# Patient Record
Sex: Female | Born: 1970 | Race: White | Hispanic: No | Marital: Single | Smoking: Current some day smoker
Health system: Southern US, Community
[De-identification: ages and names within clinical notes are randomized; demographics above are authoritative.]

## PROBLEM LIST (undated history)

## (undated) DIAGNOSIS — F329 Major depressive disorder, single episode, unspecified: Secondary | ICD-10-CM

## (undated) DIAGNOSIS — F32A Depression, unspecified: Secondary | ICD-10-CM

## (undated) HISTORY — PX: BREAST BIOPSY: SHX20

## (undated) HISTORY — PX: ABDOMINAL HYSTERECTOMY: SHX81

---

## 1997-08-24 ENCOUNTER — Other Ambulatory Visit: Admission: RE | Admit: 1997-08-24 | Discharge: 1997-08-24 | Payer: Self-pay | Admitting: *Deleted

## 1997-08-24 ENCOUNTER — Other Ambulatory Visit: Admission: RE | Admit: 1997-08-24 | Discharge: 1997-08-24 | Payer: Self-pay | Admitting: Otolaryngology

## 1999-02-21 ENCOUNTER — Other Ambulatory Visit: Admission: RE | Admit: 1999-02-21 | Discharge: 1999-02-21 | Payer: Self-pay | Admitting: *Deleted

## 1999-10-27 ENCOUNTER — Inpatient Hospital Stay (HOSPITAL_COMMUNITY): Admission: AD | Admit: 1999-10-27 | Discharge: 1999-10-28 | Payer: Self-pay | Admitting: Obstetrics and Gynecology

## 1999-10-29 ENCOUNTER — Encounter: Admission: RE | Admit: 1999-10-29 | Discharge: 2000-01-27 | Payer: Self-pay | Admitting: Obstetrics and Gynecology

## 2000-01-29 ENCOUNTER — Encounter: Admission: RE | Admit: 2000-01-29 | Discharge: 2000-03-27 | Payer: Self-pay | Admitting: Obstetrics and Gynecology

## 2000-12-03 ENCOUNTER — Encounter: Admission: RE | Admit: 2000-12-03 | Discharge: 2000-12-03 | Payer: Self-pay | Admitting: Obstetrics and Gynecology

## 2000-12-03 ENCOUNTER — Encounter: Payer: Self-pay | Admitting: Obstetrics and Gynecology

## 2001-01-21 ENCOUNTER — Other Ambulatory Visit: Admission: RE | Admit: 2001-01-21 | Discharge: 2001-01-21 | Payer: Self-pay | Admitting: Obstetrics and Gynecology

## 2001-02-23 ENCOUNTER — Encounter: Admission: RE | Admit: 2001-02-23 | Discharge: 2001-02-23 | Payer: Self-pay | Admitting: Surgery

## 2001-02-23 ENCOUNTER — Encounter (INDEPENDENT_AMBULATORY_CARE_PROVIDER_SITE_OTHER): Payer: Self-pay | Admitting: *Deleted

## 2001-02-23 ENCOUNTER — Encounter: Payer: Self-pay | Admitting: Surgery

## 2001-06-29 ENCOUNTER — Inpatient Hospital Stay (HOSPITAL_COMMUNITY): Admission: AD | Admit: 2001-06-29 | Discharge: 2001-07-03 | Payer: Self-pay | Admitting: Obstetrics & Gynecology

## 2001-06-30 ENCOUNTER — Encounter: Payer: Self-pay | Admitting: Obstetrics & Gynecology

## 2001-07-08 ENCOUNTER — Encounter (HOSPITAL_COMMUNITY): Admission: RE | Admit: 2001-07-08 | Discharge: 2001-07-27 | Payer: Self-pay | Admitting: Obstetrics and Gynecology

## 2001-07-27 ENCOUNTER — Encounter (INDEPENDENT_AMBULATORY_CARE_PROVIDER_SITE_OTHER): Payer: Self-pay | Admitting: Specialist

## 2001-07-27 ENCOUNTER — Inpatient Hospital Stay (HOSPITAL_COMMUNITY): Admission: AD | Admit: 2001-07-27 | Discharge: 2001-07-29 | Payer: Self-pay | Admitting: Obstetrics and Gynecology

## 2001-09-13 ENCOUNTER — Other Ambulatory Visit: Admission: RE | Admit: 2001-09-13 | Discharge: 2001-09-13 | Payer: Self-pay | Admitting: Obstetrics and Gynecology

## 2004-02-28 ENCOUNTER — Encounter: Admission: RE | Admit: 2004-02-28 | Discharge: 2004-02-28 | Payer: Self-pay | Admitting: Obstetrics and Gynecology

## 2006-09-11 ENCOUNTER — Encounter (INDEPENDENT_AMBULATORY_CARE_PROVIDER_SITE_OTHER): Payer: Self-pay | Admitting: Obstetrics and Gynecology

## 2006-09-11 ENCOUNTER — Ambulatory Visit (HOSPITAL_COMMUNITY): Admission: RE | Admit: 2006-09-11 | Discharge: 2006-09-11 | Payer: Self-pay | Admitting: Obstetrics and Gynecology

## 2007-01-11 ENCOUNTER — Encounter: Admission: RE | Admit: 2007-01-11 | Discharge: 2007-01-11 | Payer: Self-pay | Admitting: Family Medicine

## 2007-05-04 ENCOUNTER — Encounter (INDEPENDENT_AMBULATORY_CARE_PROVIDER_SITE_OTHER): Payer: Self-pay | Admitting: *Deleted

## 2007-05-04 ENCOUNTER — Ambulatory Visit (HOSPITAL_COMMUNITY): Admission: RE | Admit: 2007-05-04 | Discharge: 2007-05-04 | Payer: Self-pay | Admitting: Orthopedic Surgery

## 2007-05-18 ENCOUNTER — Inpatient Hospital Stay (HOSPITAL_COMMUNITY): Admission: AD | Admit: 2007-05-18 | Discharge: 2007-05-18 | Payer: Self-pay | Admitting: Obstetrics and Gynecology

## 2010-08-06 NOTE — Op Note (Signed)
NAMESHOLONDA, JOBST             ACCOUNT NO.:  [id number]   MEDICAL RECORD NO.:  [id number]          PATIENT TYPE:  AMB   LOCATION:  DAY                          FACILITY:  [facility]   PHYSICIAN:  Pioneer B. Earlene Plater, M.D.  DATE OF BIRTH:  1970-05-18   DATE OF PROCEDURE:  05/04/2007  DATE OF DISCHARGE:                               OPERATIVE REPORT   PREOPERATIVE DIAGNOSIS:  Menorrhagia.   POSTOPERATIVE DIAGNOSIS:  Menorrhagia.   PROCEDURE:  Da Vinci total laparoscopic hysterectomy and enterolysis.   SURGEON:  Marina Gravel, M.D.   ASSISTANT:  Genia Del, M.D.   ANESTHESIA:  General.   FINDINGS:  Sigmoid colon adherent to the left uterine cornu and left  ovary.  Otherwise normal-appearing tubes and ovaries, approximately 10-  week size uterus consistent with adenomyosis.   BLOOD LOSS:  200 mL.   COMPLICATIONS:  None.   SPECIMENS:  Uterus and cervix to pathology.   INDICATIONS:  Patient with a history of persistent heavy menstrual  bleeding, despite previous endometrial ablation.  The patient presents  for definitive surgical therapy.  Preop Pap and endometrial biopsy were  normal.  The patient advised of the risks of surgery include infection,  bleeding, damage to surrounding organs.   PROCEDURE:  The patient taken to the operating room.  General anesthesia  obtained.  Prepped and draped in standard fashion.  Bladder drained with  Foley catheter.  Weighted speculum inserted to the vagina.  Cervix  grasped and sounded to 7 cm.  The #6 RUMI tip was assembled inserted and  secured standard fashion.   10 mm incision placed just above the umbilicus and carried sharply to  fascia.  The fascia was divided sharply and elevated with Kocher clamps.  Posterior sheath and peritoneum entered sharply.  Pursestring suture of  0-0 Vicryl placed around the fascial defect.  Hasson cannula inserted  and secured.  Trendelenburg position obtained.  Bowel mobilized  superiorly with  Trendelenburg.  The ancillary ports placed under  laparoscopic visualization, two 8 mm to the left of the umbilicus, one 8  and one 10 mm to the right.   The robot was brought up to the patient and docked in standard fashion.   The pelvis was inspected the above findings noted.  Sigmoid colon was  moderately adherent to the left uterine cornu and left adnexa.  These  adhesions were taken down with monopolar scissors.  The course of each  ureter was identified and found to be very low in the pelvis well  inferior to the mid uterosacral ligament and lateral.   The left round ligament was divided with monopolar scissors.  Posterior  leaf of the peritoneum taken down with monopolar.  Ureter again found to  be well away.  Anterior leaf of broad ligament taken down similar manner  and advanced around the midline to begin the bladder flap.  The left  tube and utero-ovarian pedicle similarly sealed and divided with a gyrus  PK and monopolar scissors.  Entire procedure repeated on the right side  in same manner.   The uterus was deviated posteriorly and the  bladder flap advanced  sharply with monopolar scissors.  The uterine artery was taken on each  side with the PK at the level of the [facility] ring.  Colpotomy was then made  anteriorly on the Koh ring and taken around to the lateral aspect on  each side.  Posterior colpotomy made similarly.  The angles were taken  down with a gyrus PK.  Uterus was delivered into the vagina which  maintained pneumoperitoneum.  There was no active bleeding.  The vaginal  cuff was then closed with running stitch of 0-0 PDS from the right angle  to the midline and then from the left angle to the midline.  Complete  hemostasis obtained and adequate closure obtained.   Pneumoperitoneum was taken down to the lowest possible setting and the  cuff reinspected, found to be hemostatic as were the ovarian pedicles.  Therefore the robot was undocked.  Ports were removed.   Their sites were  inspected laparoscopically and were hemostatic.   Scope was removed.  Gas released.  Hasson cannula removed.  Umbilical  incision elevated with [employer] retractors.  Pursestring suture snugged  down.  This obliterated the fascial defect and no intra-abdominal  contents herniated through prior closure.  Skin was closed each site  with a 4-0 Vicryl and Dermabonds.   The patient tolerated procedure well and there were no complications.  She was taken to recovery room awake, alert in stable condition.  All  counts correct per the operating staff.      Gerri Spore B. Earlene Plater, M.D.  Electronically Signed     WBD/MEDQ  D:  05/04/2007  T:  05/05/2007  Job:  [id number]

## 2010-08-06 NOTE — Op Note (Signed)
NAMEELVERTA, DIMICELI             ACCOUNT NO.:  [id number]   MEDICAL RECORD NO.:  [id number]          PATIENT TYPE:  AMB   LOCATION:  SDC                           FACILITY:  [facility]   PHYSICIAN:  Lenoard Aden, M.D.DATE OF BIRTH:  1970-05-28   DATE OF PROCEDURE:  09/11/2006  DATE OF DISCHARGE:                               OPERATIVE REPORT   CHIEF COMPLAINT:  Menorrhagia.   POSTOPERATIVE DIAGNOSIS:  Menorrhagia.   PROCEDURE:  Diagnostic hysteroscopy, D&C, NovaSure endometrial ablation.   SURGEON:  Taavon.   ANESTHESIA:  General.   ESTIMATED BLOOD LOSS:  Less than 50 mL.   COMPLICATIONS:  None.   DRAINS:  None.   COUNTS:  Correct.   The patient to recovery in good condition.   BRIEF OPERATIVE NOTE:  After being apprised of risks of anesthesia,  infection, bleeding, injury to abdominal organs and need for repair,  delayed versus immediate complications to include bowel and bladder  injury, the patient was brought to the operating room where she was  administered a general anesthetic without complications, prepped and  draped in usual sterile fashion, catheterized until the bladder was  empty.  After achieving anesthesia, dilute Nesacaine solution was  placed, standard paracervical block, 20 mL total.  Cervix measured 2 cm.  The uterus sounded to 8 cm.  Cervix easily dilated up __________  Shawnie Pons  dilator.  Hysteroscope placed.  Visualization revealed a normal  endometrial cavity with thickened anterior endometrial.  Four quadrant  sharp curettage was done without difficulty, obtaining large amount of  endometrial tissue which was sent to pathology for a permanent  evaluation.  At this time, the NovaSure device was placed, and the  cavity width was set as noted.  CO2 test was applied and was negative  after the device was seated in the appropriate fashion.  NovaSure was  enabled and initiated for 1 minute and 10 seconds without difficulty,  and device was removed intact.   Revisualization of the cavity revealed  the  endometrial cavity to be well ablated and no evidence of uterine  perforation noted.  The instruments were all removed.  Fluid deficit of  30 mL was noted.  The patient tolerated the procedure well, was awakened  and transferred to recovery in good condition.      Lenoard Aden, M.D.  Electronically Signed     RJT/MEDQ  D:  09/11/2006  T:  09/11/2006  Job:  [id number]

## 2010-08-09 NOTE — Discharge Summary (Signed)
[facility]  Patient:    Vanessa Mckenzie, Vanessa Mckenzie Visit Number: [id number] MRN: [id number]          Service Type: ANT Location: [facility] Attending Physician:  Frederich Balding Dictated by:   Julio Sicks, N.P. Admit Date:  07/08/2001 Disc. Date: 07/03/01                             Discharge Summary  ADMISSION DIAGNOSES:          1. Intrauterine pregnancy at 33 weeks with twin                                  gestation and preterm labor.                               2. Positive Group B Streptococcus with previous                                  pregnancy.  DISCHARGE DIAGNOSES:          1. Intrauterine pregnancy at 33 weeks with twin                                  gestation and preterm labor.                               2. Positive Group B Streptococcus with previous                                  pregnancy.  REASON FOR ADMISSION:         Please see written History & Physical.  HOSPITAL COURSE:              The patient was admitted to the hospital, gravida 3, para 2, with an estimated gestational age of [redacted] weeks with a twin gestation.  Cervix was noted to be 2 cm dilated, 75% effaced per exam in office.  The patient was admitted for tocolysis, betamethasone, and observation.  Oral terbutaline was administered with good results. Betamethasone was not given.  Ultrasound revealed a twin gestation at 33 weeks 1 day with twin A slightly lagging behind twin B in growth.  Cervix was mildly shortened measuring 2.3 cm.  The following morning, contractions increased.  No bleeding was noted. Membranes continued to be intact.  Fetal heart rate tracings were reactive. Cervical exam revealed 2 cm dilated, 50% effaced, -2 station.  Contractions were every 7 minutes.  Terbutaline was discontinued, and magnesium sulfate was started.  IV antibiotics were also administered.  On the following morning, the patient was tolerating magnesium sulfate  well. Contractions had decreased to 2 or less per hour.  Fetal heart rate tracings were reactive.  Magnesium sulfate was weaned, and the patient was started back on oral terbutaline.  The patient was discharged the following morning on oral terbutaline. Instructions were given.  CONDITION UPON DISCHARGE:     Good.  DIET:  Regular as tolerated.  ACTIVITY:                     Bed rest with bathroom privileges and pelvic rest.  FOLLOWUP:                     The patient is to return to the office in approximately two days for cervical check.  DISCHARGE MEDICATIONS:        1. Terbutaline 2.5 mg p.o. every 4 hours.                               2. Prenatal vitamins 1 p.o. q.d. Dictated by:   Julio Sicks, N.P. Attending Physician:  Frederich Balding DD:  07/19/01 TD:  07/20/01 Job: [id number] [phone]

## 2010-10-16 ENCOUNTER — Other Ambulatory Visit: Payer: Self-pay | Admitting: Family Medicine

## 2010-10-16 DIAGNOSIS — Z1231 Encounter for screening mammogram for malignant neoplasm of breast: Secondary | ICD-10-CM

## 2010-10-21 ENCOUNTER — Ambulatory Visit
Admission: RE | Admit: 2010-10-21 | Discharge: 2010-10-21 | Disposition: A | Discharge status: Home / Self Care | Payer: BC Managed Care – PPO | Source: Ambulatory Visit | Attending: Family Medicine | Admitting: Family Medicine

## 2010-10-21 DIAGNOSIS — Z1231 Encounter for screening mammogram for malignant neoplasm of breast: Secondary | ICD-10-CM

## 2010-12-13 LAB — DIFFERENTIAL
Basophils Relative: 1
Eosinophils Absolute: 0.1
Eosinophils Relative: 2
Monocytes Absolute: 0.4
Monocytes Relative: 6
Neutro Abs: 4
Neutrophils Relative %: 65

## 2010-12-13 LAB — CBC
HCT: 40.4
MCHC: 34
Platelets: 190
WBC: 6.1

## 2011-01-08 LAB — CBC
HCT: 40.9
Hemoglobin: 13.9
MCHC: 34.1
MCV: 89.8
Platelets: 191
RBC: 4.55
RDW: 12.2
WBC: 6.2

## 2012-06-07 ENCOUNTER — Other Ambulatory Visit: Payer: Self-pay | Admitting: Neurology

## 2012-06-07 DIAGNOSIS — M542 Cervicalgia: Secondary | ICD-10-CM

## 2012-06-07 DIAGNOSIS — R519 Headache, unspecified: Secondary | ICD-10-CM

## 2012-06-18 ENCOUNTER — Ambulatory Visit
Admission: RE | Admit: 2012-06-18 | Discharge: 2012-06-18 | Disposition: A | Discharge status: Home / Self Care | Payer: BC Managed Care – PPO | Source: Ambulatory Visit | Attending: Neurology | Admitting: Neurology

## 2012-06-18 DIAGNOSIS — M542 Cervicalgia: Secondary | ICD-10-CM

## 2012-06-18 DIAGNOSIS — R519 Headache, unspecified: Secondary | ICD-10-CM

## 2016-04-16 DIAGNOSIS — H16141 Punctate keratitis, right eye: Secondary | ICD-10-CM | POA: Diagnosis not present

## 2016-04-30 DIAGNOSIS — Z0000 Encounter for general adult medical examination without abnormal findings: Secondary | ICD-10-CM | POA: Diagnosis not present

## 2016-04-30 DIAGNOSIS — Z13220 Encounter for screening for lipoid disorders: Secondary | ICD-10-CM | POA: Diagnosis not present

## 2016-05-12 ENCOUNTER — Other Ambulatory Visit: Payer: Self-pay | Admitting: Family Medicine

## 2016-05-12 DIAGNOSIS — Z1231 Encounter for screening mammogram for malignant neoplasm of breast: Secondary | ICD-10-CM

## 2016-05-26 ENCOUNTER — Ambulatory Visit
Admission: RE | Admit: 2016-05-26 | Discharge: 2016-05-26 | Disposition: A | Discharge status: Home / Self Care | Payer: 59 | Source: Ambulatory Visit | Attending: Family Medicine | Admitting: Family Medicine

## 2016-05-26 DIAGNOSIS — Z1231 Encounter for screening mammogram for malignant neoplasm of breast: Secondary | ICD-10-CM | POA: Diagnosis not present

## 2016-05-28 ENCOUNTER — Other Ambulatory Visit: Payer: Self-pay | Admitting: Family Medicine

## 2016-05-28 DIAGNOSIS — R928 Other abnormal and inconclusive findings on diagnostic imaging of breast: Secondary | ICD-10-CM

## 2016-05-30 ENCOUNTER — Ambulatory Visit
Admission: RE | Admit: 2016-05-30 | Discharge: 2016-05-30 | Disposition: A | Discharge status: Home / Self Care | Payer: 59 | Source: Ambulatory Visit | Attending: Family Medicine | Admitting: Family Medicine

## 2016-05-30 DIAGNOSIS — N6011 Diffuse cystic mastopathy of right breast: Secondary | ICD-10-CM | POA: Diagnosis not present

## 2016-05-30 DIAGNOSIS — R928 Other abnormal and inconclusive findings on diagnostic imaging of breast: Secondary | ICD-10-CM

## 2016-05-30 DIAGNOSIS — N6012 Diffuse cystic mastopathy of left breast: Secondary | ICD-10-CM | POA: Diagnosis not present

## 2016-06-21 ENCOUNTER — Emergency Department (HOSPITAL_COMMUNITY)
Admission: EM | Admit: 2016-06-21 | Discharge: 2016-06-22 | Disposition: A | Discharge status: Home / Self Care | Payer: 59 | Attending: Emergency Medicine | Admitting: Emergency Medicine

## 2016-06-21 ENCOUNTER — Encounter (HOSPITAL_COMMUNITY): Payer: Self-pay | Admitting: Emergency Medicine

## 2016-06-21 DIAGNOSIS — S41112A Laceration without foreign body of left upper arm, initial encounter: Secondary | ICD-10-CM | POA: Insufficient documentation

## 2016-06-21 DIAGNOSIS — Y939 Activity, unspecified: Secondary | ICD-10-CM | POA: Diagnosis not present

## 2016-06-21 DIAGNOSIS — X781XXA Intentional self-harm by knife, initial encounter: Secondary | ICD-10-CM | POA: Diagnosis not present

## 2016-06-21 DIAGNOSIS — S41111A Laceration without foreign body of right upper arm, initial encounter: Secondary | ICD-10-CM

## 2016-06-21 DIAGNOSIS — Z7289 Other problems related to lifestyle: Secondary | ICD-10-CM

## 2016-06-21 DIAGNOSIS — Y999 Unspecified external cause status: Secondary | ICD-10-CM | POA: Insufficient documentation

## 2016-06-21 DIAGNOSIS — F17210 Nicotine dependence, cigarettes, uncomplicated: Secondary | ICD-10-CM | POA: Insufficient documentation

## 2016-06-21 DIAGNOSIS — Z79899 Other long term (current) drug therapy: Secondary | ICD-10-CM | POA: Insufficient documentation

## 2016-06-21 DIAGNOSIS — Y929 Unspecified place or not applicable: Secondary | ICD-10-CM | POA: Diagnosis not present

## 2016-06-21 HISTORY — DX: Major depressive disorder, single episode, unspecified: F32.9

## 2016-06-21 HISTORY — DX: Depression, unspecified: F32.A

## 2016-06-21 LAB — CBC
HCT: 40.2 % (ref 36.0–46.0)
Hemoglobin: 13.4 g/dL (ref 12.0–15.0)
MCH: 31 pg (ref 26.0–34.0)
MCHC: 33.3 g/dL (ref 30.0–36.0)
MCV: 93.1 fL (ref 78.0–100.0)
Platelets: 221 10*3/uL (ref 150–400)
RBC: 4.32 MIL/uL (ref 3.87–5.11)
RDW: 12.8 % (ref 11.5–15.5)
WBC: 8.3 10*3/uL (ref 4.0–10.5)

## 2016-06-21 MED ORDER — LIDOCAINE-EPINEPHRINE (PF) 2 %-1:200000 IJ SOLN
20.0000 mL | Freq: Once | INTRAMUSCULAR | Status: AC
  Administered 2016-06-22: 20 mL
  Filled 2016-06-21: qty 20

## 2016-06-21 MED ORDER — TETANUS-DIPHTH-ACELL PERTUSSIS 5-2.5-18.5 LF-MCG/0.5 IM SUSP
0.5000 mL | Freq: Once | INTRAMUSCULAR | Status: DC
  Filled 2016-06-21: qty 0.5

## 2016-06-21 NOTE — ED Provider Notes (Signed)
[facility]-EMERGENCY DEPT Provider Note   CSN: [id number] Arrival date & time: 06/21/16  2221   By signing my name below, I, [name], attest that this documentation has been prepared under the direction and in the presence of  [employer], PA-C. Electronically Signed: [name], ED Scribe. 06/21/16. 12:26 AM.   History   Chief Complaint Chief Complaint  Patient presents with  . Laceration  . Suicidal    HPI Comments:  Vanessa Mckenzie is a 46 y.o. female, with a PMHx of depression, who presents to the Emergency Department complaining of 2, acute onset, moderate lacerations to her left upper arm which occurred at 5 PM today. Pt states she used a pocket knife to cut herself. She states cutting herself provides a type of relief. Pt also notes she was not able to take her Effexor yesterday or today. No alleviating or modifying factors noted. Pt denies suicidal ideation, numbness to her extremity, weakness to her extremity or any other associated symptoms. She also denies aspirin or blood thinner use. Tetanus is UTD. No other complaints noted.   The history is provided by the patient and medical records. No language interpreter was used.    Past Medical History:  Diagnosis Date  . Depression     There are no active problems to display for this patient.   Past Surgical History:  Procedure Laterality Date  . ABDOMINAL HYSTERECTOMY    . BREAST BIOPSY      OB History    No data available       Home Medications    Prior to Admission medications   Medication Sig Start Date End Date Taking? Authorizing Provider  ALPRAZolam Prudy Feeler) 0.25 MG tablet Take 1 tablet by mouth daily. 06/11/16   Historical Provider, MD  cyclobenzaprine (FLEXERIL) 10 MG tablet Take 1 tablet by mouth 3 (three) times daily as needed for muscle spasms. 05/21/16   Historical Provider, MD  rizatriptan (MAXALT) 10 MG tablet Take 1 tablet by mouth daily as needed for migraine. 05/21/16   Historical  Provider, MD  venlafaxine XR (EFFEXOR-XR) 150 MG 24 hr capsule Take 1 capsule by mouth daily. 05/21/16   Historical Provider, MD  Vitamin D, Ergocalciferol, (DRISDOL) 50000 units CAPS capsule Take 1 capsule by mouth once a week. 06/11/16   Historical Provider, MD  zolpidem (AMBIEN) 10 MG tablet Take 1 tablet by mouth at bedtime as needed. 05/21/16   Historical Provider, MD    Family History History reviewed. No pertinent family history.  Social History Social History  Substance Use Topics  . Smoking status: Current Some Day Smoker    Types: E-cigarettes  . Smokeless tobacco: Never Used  . Alcohol use Yes     Comment: occasional     Allergies   Codeine   Review of Systems Review of Systems  Skin: Positive for wound.  Neurological: Negative for weakness and numbness.  Psychiatric/Behavioral: Positive for self-injury. Negative for suicidal ideas.  All other systems reviewed and are negative.    Physical Exam Updated Vital Signs BP 124/84 (BP Location: Right Arm)   Pulse 89   Temp 98.6 F (37 C) (Oral)   Resp 18   Ht  (1.676 m)   Wt 130 lb (59 kg)   SpO2 100%   BMI 20.98 kg/m   Physical Exam  Constitutional: She is oriented to person, place, and time. She appears well-developed and well-nourished. No distress.  HENT:  Head: Normocephalic and atraumatic.  Eyes: Conjunctivae are normal.  No scleral icterus.  Neck: Normal range of motion.  Cardiovascular: Normal rate, regular rhythm, normal heart sounds and intact distal pulses.   No murmur heard. Capillary refill < 3 sec  Pulmonary/Chest: Effort normal and breath sounds normal. No respiratory distress.  Musculoskeletal: Normal range of motion. She exhibits no edema.  ROM: full in the LUE  Neurological: She is alert and oriented to person, place, and time.  Sensation: intact to normal touch in the LUE Strength: 5/5 in the LUE  Skin: Skin is warm and dry. She is not diaphoretic.  There is a 7 cm laceration to  the proximal left upper extremity.  There is a 4 cm laceration just distal to the larger laceration on the left upper extremity.  Numerous superficial cuts to the upper and lower portion of the LUE.   Psychiatric: She has a normal mood and affect.  Nursing note and vitals reviewed.    ED Treatments / Results  DIAGNOSTIC STUDIES:  Oxygen Saturation is 100% on RA, normal by my interpretation.    COORDINATION OF CARE:  12:13 AM Discussed treatment plan with pt at bedside and pt agreed to plan.  Labs (all labs ordered are listed, but only abnormal results are displayed) Labs Reviewed  COMPREHENSIVE METABOLIC PANEL - Abnormal; Notable for the following:       Result Value   Glucose, Bld 110 (*)    All other components within normal limits  ACETAMINOPHEN LEVEL - Abnormal; Notable for the following:    Acetaminophen (Tylenol), Serum <10 (*)    All other components within normal limits  ETHANOL  SALICYLATE LEVEL  CBC  RAPID URINE DRUG SCREEN, HOSP PERFORMED  POC URINE PREG, ED    Procedures .Marland KitchenLaceration Repair Date/Time: 06/22/2016 12:00 AM Performed by: Dierdre Forth Authorized by: Dierdre Forth   Consent:    Consent obtained:  Verbal   Consent given by:  Patient   Risks discussed:  Infection Anesthesia (see MAR for exact dosages):    Anesthesia method:  Local infiltration   Local anesthetic:  Lidocaine 2% WITH epi Laceration details:    Location:  Shoulder/arm   Shoulder/arm location:  L upper arm   Length (cm):  7 Repair type:    Repair type:  Simple Pre-procedure details:    Preparation:  Patient was prepped and draped in usual sterile fashion Exploration:    Wound exploration: wound explored through full range of motion and entire depth of wound probed and visualized     Contaminated: no   Treatment:    Area cleansed with:  Shur-Clens   Amount of cleaning:  Standard   Visualized foreign bodies/material removed: no   Skin repair:    Repair  method:  Sutures   Suture size:  3-0   Suture material:  Prolene   Suture technique:  Simple interrupted   Number of sutures:  6 Approximation:    Approximation:  Close   Vermilion border: well-aligned   Post-procedure details:    Patient tolerance of procedure:  Tolerated well, no immediate complications .Marland KitchenLaceration Repair Date/Time: 06/22/2016 12:13 AM Performed by: Dierdre Forth Authorized by: Dierdre Forth   Consent:    Consent obtained:  Verbal   Consent given by:  Patient   Risks discussed:  Infection Anesthesia (see MAR for exact dosages):    Anesthesia method:  Local infiltration   Local anesthetic:  Lidocaine 2% WITH epi Laceration details:    Location:  Shoulder/arm   Shoulder/arm location:  L upper arm  Length (cm):  4 Repair type:    Repair type:  Simple Pre-procedure details:    Preparation:  Patient was prepped and draped in usual sterile fashion Exploration:    Hemostasis achieved with:  Direct pressure   Wound exploration: wound explored through full range of motion     Contaminated: no   Treatment:    Area cleansed with:  Shur-Clens   Amount of cleaning:  Standard   Visualized foreign bodies/material removed: no   Skin repair:    Repair method:  Sutures   Suture size:  3-0   Suture material:  Prolene   Suture technique:  Simple interrupted   Number of sutures:  3 Approximation:    Approximation:  Close   Vermilion border: well-aligned   Post-procedure details:    Patient tolerance of procedure:  Tolerated well, no immediate complications   (including critical care time)  Medications Ordered in ED Medications  Tdap (BOOSTRIX) injection 0.5 mL (0.5 mLs Intramuscular Not Given 06/22/16 0002)  LORazepam (ATIVAN) tablet 1 mg (not administered)  ibuprofen (ADVIL,MOTRIN) tablet 600 mg (not administered)  zolpidem (AMBIEN) tablet 5 mg (not administered)  nicotine (NICODERM CQ - dosed in mg/24 hours) patch 21 mg (not administered)    ondansetron (ZOFRAN) tablet 4 mg (not administered)  alum & mag hydroxide-simeth (MAALOX/MYLANTA) 200-200-20 MG/5ML suspension 30 mL (not administered)  lidocaine-EPINEPHrine (XYLOCAINE W/EPI) 2 %-1:200000 (PF) injection 20 mL (20 mLs Infiltration Given 06/22/16 0034)     Initial Impression / Assessment and Plan / ED Course  I have reviewed the triage vital signs and the nursing notes.  Pertinent labs & imaging results that were available during my care of the patient were reviewed by me and considered in my medical decision making (see chart for details).     Pressure irrigation performed. Wound explored and base of wound visualized in a bloodless field without evidence of foreign body.  Laceration occurred < 8 hours prior to repair which was well tolerated. Tdap up to date.  Pt has no comorbidities to effect normal wound healing. Discussed suture home care with patient and answered questions. Pt to follow-up for wound check and suture removal in 7 days.  Pt is hemodynamically stable and medically cleared for TTS evaluation.    Final Clinical Impressions(s) / ED Diagnoses   Final diagnoses:  Deliberate self-cutting    New Prescriptions New Prescriptions   No medications on file    I personally performed the services described in this documentation, which was scribed in my presence. The recorded information has been reviewed and is accurate.     Dierdre Forth, PA-C 06/22/16 0227   3:55 AM Patient evaluated by TTS he recommends outpatient therapy. Patient will be discharged home with resources.    Dahlia Client Pinki Rottman, PA-C 06/22/16 1610    Raeford Razor, MD 06/23/16 [phone]

## 2016-06-21 NOTE — ED Triage Notes (Signed)
Pt presents with laceration to left bicep that was self produced from a knife at appx 1600 today. Pt reports not taking Effexor yesterday and has been feeling increasing anger over the last few months. Pt reports prior hx of self harm. 5.5cm laceration to left bicep

## 2016-06-22 LAB — COMPREHENSIVE METABOLIC PANEL
ALT: 20 U/L (ref 14–54)
AST: 19 U/L (ref 15–41)
Albumin: 4.4 g/dL (ref 3.5–5.0)
Alkaline Phosphatase: 67 U/L (ref 38–126)
Anion gap: 7 (ref 5–15)
BUN: 16 mg/dL (ref 6–20)
CO2: 27 mmol/L (ref 22–32)
Calcium: 9.3 mg/dL (ref 8.9–10.3)
Chloride: 105 mmol/L (ref 101–111)
Creatinine, Ser: 0.99 mg/dL (ref 0.44–1.00)
GFR calc Af Amer: >60 mL/min (ref >60)
GFR calc non Af Amer: >60 mL/min (ref >60)
Glucose, Bld: 110 mg/dL — ABNORMAL HIGH (ref 65–99)
Potassium: 3.8 mmol/L (ref 3.5–5.1)
Sodium: 139 mmol/L (ref 135–145)
Total Bilirubin: 0.4 mg/dL (ref 0.3–1.2)
Total Protein: 7.7 g/dL (ref 6.5–8.1)

## 2016-06-22 LAB — SALICYLATE LEVEL: Salicylate Lvl: <7 mg/dL (ref 2.8–30.0)

## 2016-06-22 LAB — ACETAMINOPHEN LEVEL: Acetaminophen (Tylenol), Serum: <10 ug/mL — ABNORMAL LOW (ref 10–30)

## 2016-06-22 LAB — ETHANOL: Alcohol, Ethyl (B): <5 mg/dL (ref <5)

## 2016-06-22 MED ORDER — ONDANSETRON HCL 4 MG PO TABS: 4.0000 mg | ORAL_TABLET | Freq: Three times a day (TID) | ORAL | Status: DC | PRN

## 2016-06-22 MED ORDER — NICOTINE 21 MG/24HR TD PT24: 21.0000 mg | MEDICATED_PATCH | Freq: Every day | TRANSDERMAL | Status: DC

## 2016-06-22 MED ORDER — IBUPROFEN 200 MG PO TABS: 600.0000 mg | ORAL_TABLET | Freq: Three times a day (TID) | ORAL | Status: DC | PRN

## 2016-06-22 MED ORDER — ZOLPIDEM TARTRATE 5 MG PO TABS: 5.0000 mg | ORAL_TABLET | Freq: Every evening | ORAL | Status: DC | PRN

## 2016-06-22 MED ORDER — LORAZEPAM 1 MG PO TABS: 1.0000 mg | ORAL_TABLET | Freq: Three times a day (TID) | ORAL | Status: DC | PRN

## 2016-06-22 MED ORDER — ALUM & MAG HYDROXIDE-SIMETH 200-200-20 MG/5ML PO SUSP: 30.0000 mL | ORAL | Status: DC | PRN

## 2016-06-22 NOTE — Discharge Instructions (Signed)
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
## 2016-06-22 NOTE — ED Notes (Signed)
Gilmer Mor pts boyfriend phone # [phone].

## 2017-05-12 ENCOUNTER — Other Ambulatory Visit: Payer: Self-pay | Admitting: Family Medicine

## 2017-05-12 DIAGNOSIS — R1011 Right upper quadrant pain: Secondary | ICD-10-CM | POA: Diagnosis not present

## 2017-05-12 DIAGNOSIS — Z0000 Encounter for general adult medical examination without abnormal findings: Secondary | ICD-10-CM | POA: Diagnosis not present

## 2017-05-12 DIAGNOSIS — R109 Unspecified abdominal pain: Secondary | ICD-10-CM

## 2017-05-12 DIAGNOSIS — Z13220 Encounter for screening for lipoid disorders: Secondary | ICD-10-CM | POA: Diagnosis not present

## 2017-05-12 DIAGNOSIS — E559 Vitamin D deficiency, unspecified: Secondary | ICD-10-CM | POA: Diagnosis not present

## 2017-05-25 ENCOUNTER — Other Ambulatory Visit: Payer: 59

## 2017-07-27 DIAGNOSIS — J392 Other diseases of pharynx: Secondary | ICD-10-CM | POA: Diagnosis not present

## 2017-10-14 DIAGNOSIS — N3001 Acute cystitis with hematuria: Secondary | ICD-10-CM | POA: Diagnosis not present

## 2017-11-24 ENCOUNTER — Other Ambulatory Visit: Payer: Self-pay | Admitting: Family Medicine

## 2017-11-25 ENCOUNTER — Other Ambulatory Visit: Payer: Self-pay | Admitting: Family Medicine

## 2017-11-25 DIAGNOSIS — N6320 Unspecified lump in the left breast, unspecified quadrant: Secondary | ICD-10-CM

## 2017-11-30 ENCOUNTER — Ambulatory Visit
Admission: RE | Admit: 2017-11-30 | Discharge: 2017-11-30 | Disposition: A | Discharge status: Home / Self Care | Payer: 59 | Source: Ambulatory Visit | Attending: Family Medicine | Admitting: Family Medicine

## 2017-11-30 DIAGNOSIS — R922 Inconclusive mammogram: Secondary | ICD-10-CM | POA: Diagnosis not present

## 2017-11-30 DIAGNOSIS — N6320 Unspecified lump in the left breast, unspecified quadrant: Secondary | ICD-10-CM

## 2017-11-30 DIAGNOSIS — N6002 Solitary cyst of left breast: Secondary | ICD-10-CM | POA: Diagnosis not present

## 2019-01-19 IMAGING — MG DIGITAL DIAGNOSTIC BILATERAL MAMMOGRAM WITH TOMO AND CAD
6 of 10 series · 6 of 30 positions shown · non-contrast
Comparison: Previous exam(s).

CLINICAL DATA: 47-year-old patient with history of breast cysts
present since for evaluation of a palpable lump in the 12 o'clock
position of the left breast.

EXAM:
DIGITAL DIAGNOSTIC BILATERAL MAMMOGRAM WITH CAD AND TOMO
ULTRASOUND LEFT BREAST

[L TAN synth-2D]
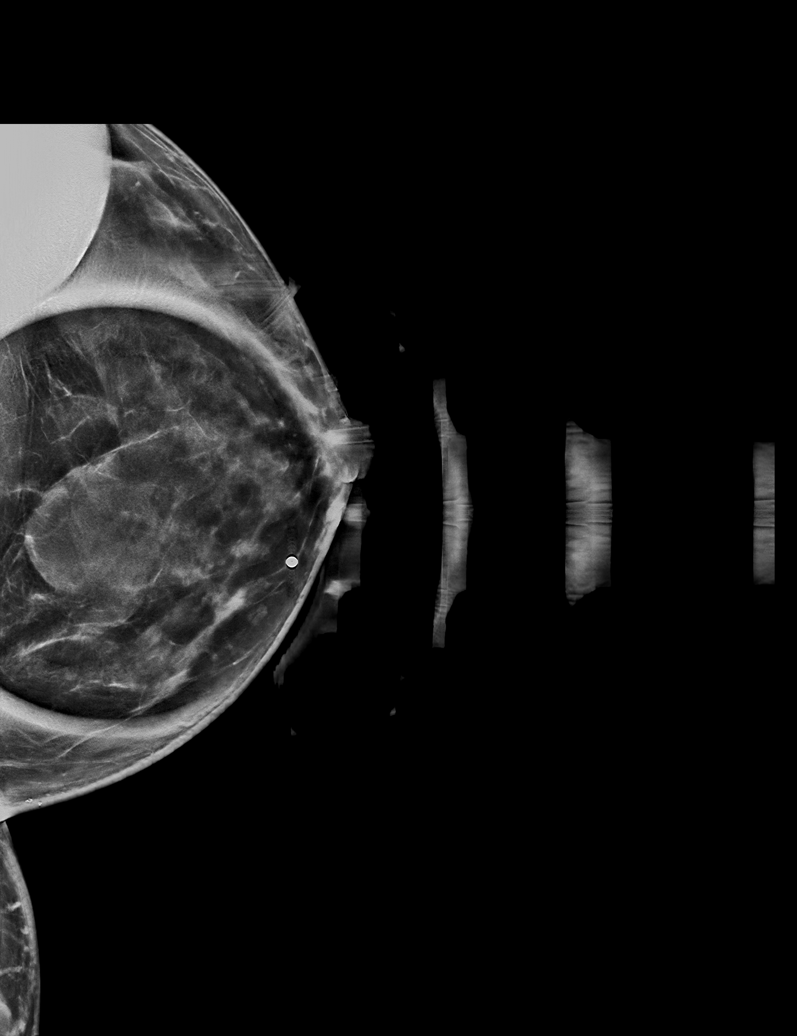

[R MLO synth-2D]
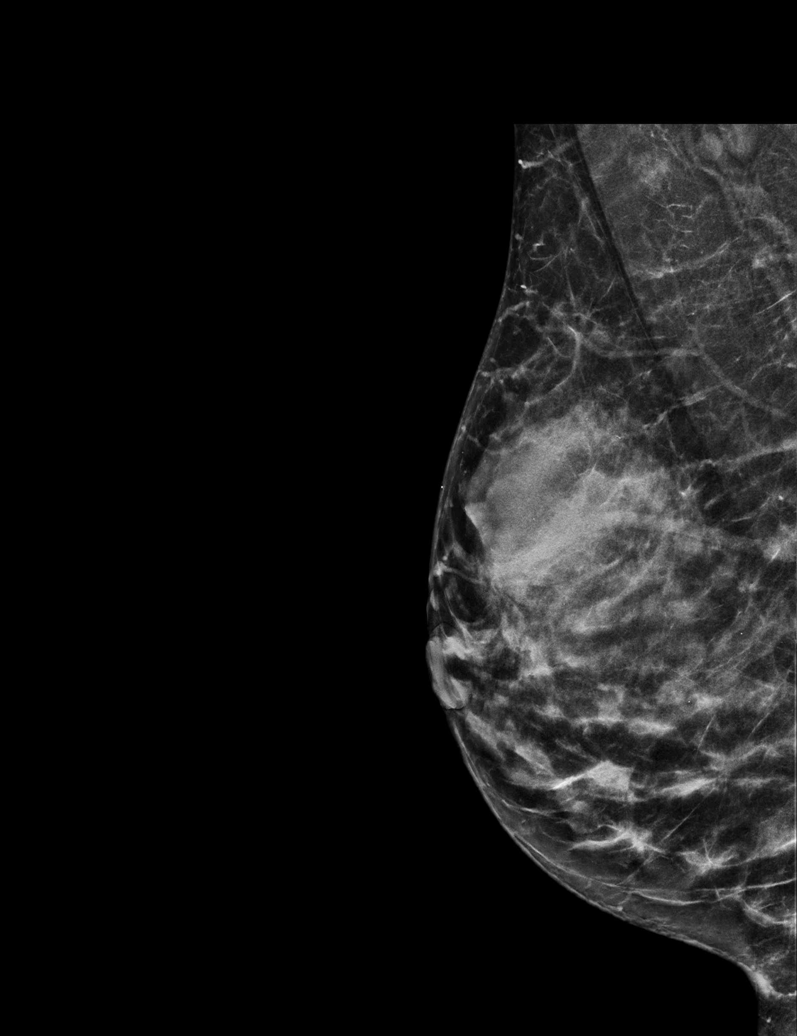

[R CC synth-2D]
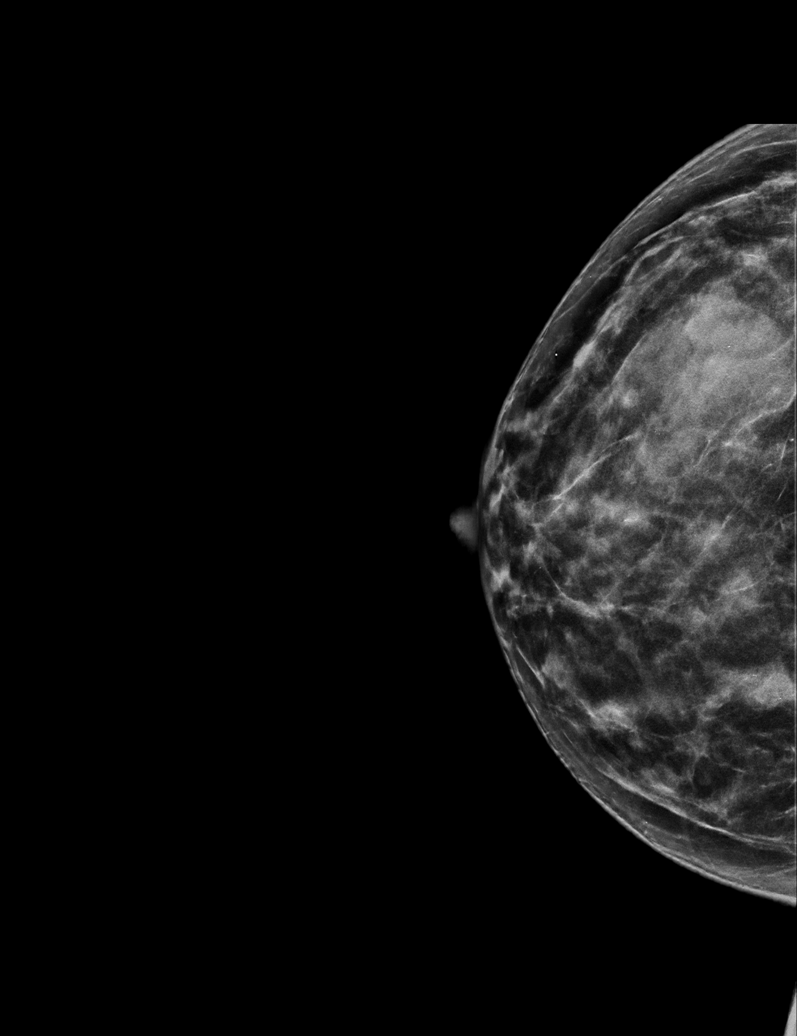

[L CC synth-2D]
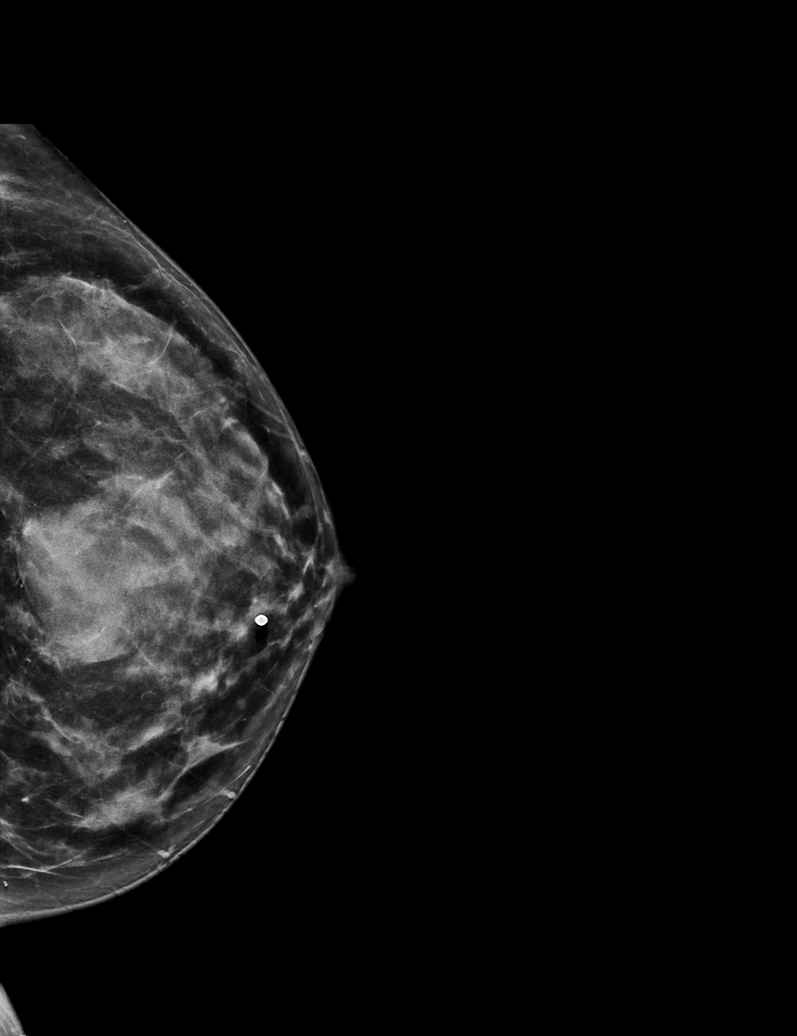

[L MLO synth-2D]
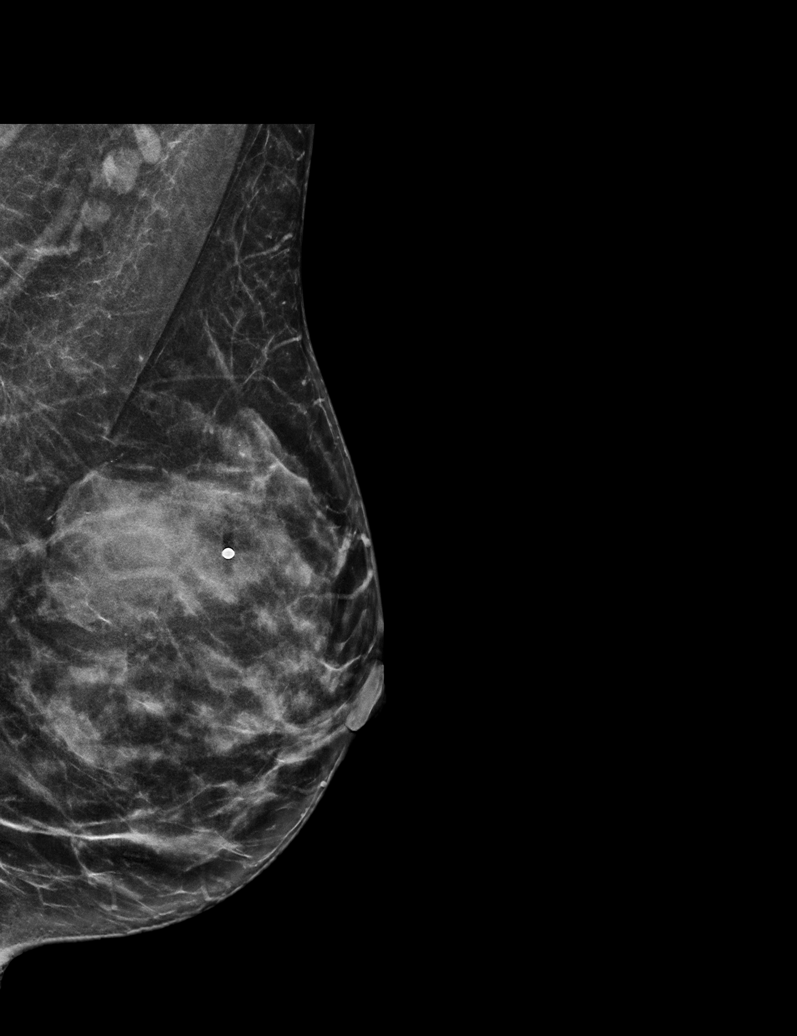

[L TAN tomo · tomo slice 35/69.0]
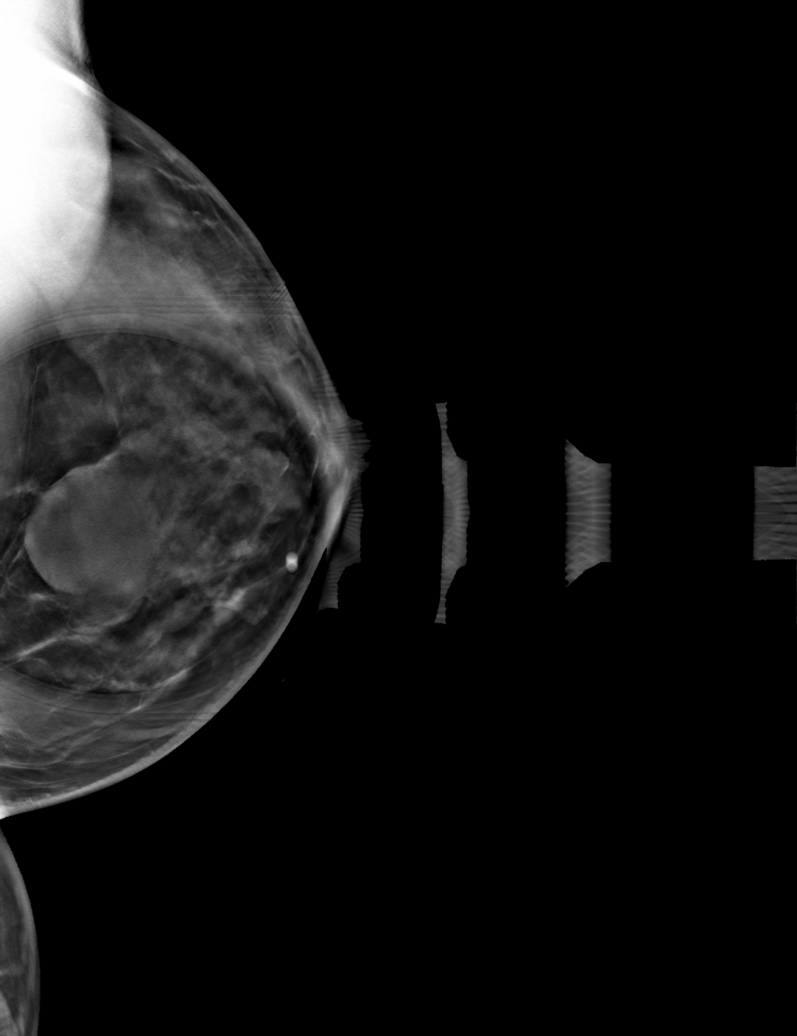

[6 of 30 positions shown; findings below may reference images not displayed]

ACR Breast Density Category d: The breast tissue is extremely dense,
which lowers the sensitivity of mammography.
FINDINGS: There is a circumscribed oval mass in the upper central left breast
in the region of palpable concern, as denoted by an overlying skin
marker. Spot compression view with tomography shows a circumscribed
oval mass measuring approximately 3 cm. Circumscribed mass in the
outer right breast was previously demonstrated to be a cyst. No
irregular mass, architectural distortion, or suspicious
microcalcification is identified in either breast.

Mammographic images were processed with CAD.

On physical exam, there is a smooth, mobile 3 cm mass 12 o'clock
position left breast 2-3 cm from the nipple.

Targeted ultrasound is performed, showing a circumscribed anechoic
mass consistent with a simple cyst at 12 o'clock position 2-3 cm
from the nipple measuring 3.0 x 2.9 x 1.8 cm.
IMPRESSION: Simple cyst 12 o'clock position left breast. No evidence of
malignancy bilaterally.

RECOMMENDATION:
Screening mammogram in one year.(Code:[id number]-[id number])

I have discussed the findings and recommendations with the patient.
Results were also provided in writing at the conclusion of the
visit. If applicable, a reminder letter will be sent to the patient
regarding the next appointment.

BI-RADS CATEGORY  2: Benign.

## 2020-03-22 ENCOUNTER — Other Ambulatory Visit: Payer: No Typology Code available for payment source

## 2020-03-22 DIAGNOSIS — Z20822 Contact with and (suspected) exposure to covid-19: Secondary | ICD-10-CM

## 2020-03-24 LAB — SARS-COV-2, NAA 2 DAY TAT

## 2020-03-24 LAB — NOVEL CORONAVIRUS, NAA: SARS-CoV-2, NAA: NOT DETECTED

## 2021-09-26 ENCOUNTER — Other Ambulatory Visit: Payer: Self-pay | Admitting: Family Medicine

## 2021-09-26 DIAGNOSIS — N6310 Unspecified lump in the right breast, unspecified quadrant: Secondary | ICD-10-CM

## 2021-09-26 DIAGNOSIS — N6320 Unspecified lump in the left breast, unspecified quadrant: Secondary | ICD-10-CM

## 2021-10-03 ENCOUNTER — Ambulatory Visit
Admission: RE | Admit: 2021-10-03 | Discharge: 2021-10-03 | Disposition: A | Discharge status: Home / Self Care | Payer: No Typology Code available for payment source | Source: Ambulatory Visit | Attending: Family Medicine | Admitting: Family Medicine

## 2021-10-03 DIAGNOSIS — N6320 Unspecified lump in the left breast, unspecified quadrant: Secondary | ICD-10-CM

## 2021-10-03 DIAGNOSIS — N6310 Unspecified lump in the right breast, unspecified quadrant: Secondary | ICD-10-CM

## 2023-04-17 NOTE — Progress Notes (Signed)
 [facility] Visit [facility]    Vanessa Mckenzie is a 53 y.o. female who presents with cough, body aches.   History obtained from patient.   Assessment & Plan:    Assessment & Plan Myalgia  Orders: .  Influenza Molecular POCT  Influenza due to identified novel influenza A virus with other respiratory manifestations  Orders: .  oseltamivir (TAMIFLU) 75 MG capsule; Take 1 capsule (75 mg total) by mouth 2 (two) times a day for 5 days .  benzonatate (TESSALON) 100 MG capsule; Swallow whole one (100mg ) capsule by mouth 3 times a day  as needed.Do not break, chew, dissolve, cut or crush. -Start or continue with OTC medications (such as ibuprofen , tylenol , mucinex) for symptomatic relief -Push fluids for adequate hydration; aim to drink half body weight in ounces of water daily -Get adequate rest -Use warm salt water gargles as needed  -Patient understands to seek immediate medical attention if they start to have chest pain, shortness of breath, spiking fevers, etc  Influenza with respiratory manifestation other than pneumonia  Orders: .  benzonatate (TESSALON) 100 MG capsule; Swallow whole one (100mg ) capsule by mouth 3 times a day  as needed.Do not break, chew, dissolve, cut or crush.  Acute suppurative otitis media of right ear without spontaneous rupture of tympanic membrane, recurrence not specified  Orders: .  amoxicillin (AMOXIL) 875 MG tablet; Take 1 tablet (875 mg total) by mouth 2 (two) times a day for 7 days    Return if symptoms worsen or fail to improve.  Subjective     Respiratory Duration of current symptoms:  3 days Onset quality:  Suddenly Cough Characteristics: nonproductive   Nonproductive Cough: dry   Associated symptoms: body aches, chills, cough, nasal congestion, decreased appetite, ear pain, fatigue, fever, headache and sweats   Associated symptoms: no chest pain, no chest tightness, no diarrhea, no malaise, no nausea, no nocturnal dyspnea, no postnasal  drip, no rash, no rhinorrhea, no shortness of breath, no sinus pain, no sneezing, no sore throat, no swollen glands, no syncope, no unexpected weight change, no vomiting, no wheezing and no other   Treatments tried:  Rest, pain relievers and over-the-counter medication (advil ) Response to treatment:  Relieved symptoms Relief: mild relief   Special considerations:  Exposure to illness (exposured to flu A) Fever Duration of current symptoms:  3 days Onset quality:  Suddenly Associated symptoms: body aches, chills, cough, nasal congestion, decreased appetite, ear pain, fatigue, fever, headache and sweats   Associated symptoms: no diarrhea, no nausea, no other, no rash, no rhinorrhea, no sore throat, no swollen glands and no vomiting   Treatments tried:  Rest, pain relievers and over-the-counter medication (advil ) Response to treatment:  Relieved symptoms Relief: mild relief   Special considerations:  Exposure to illness (exposured to flu A)    Allergies  Allergen Reactions  . Codeine Itching  . Erythromycin GI Intolerance    Review of Systems  Constitutional:  Positive for appetite change, chills, diaphoresis, fatigue and fever. Negative for unexpected weight change.  HENT:  Positive for congestion and ear pain. Negative for postnasal drip, rhinorrhea, sinus pain, sneezing and sore throat.   Respiratory:  Positive for cough. Negative for chest tightness, shortness of breath and wheezing.   Cardiovascular:  Negative for chest pain.  Gastrointestinal:  Negative for diarrhea, nausea and vomiting.  Musculoskeletal:  Positive for myalgias.  Skin:  Negative for rash.  Neurological:  Positive for headaches. Negative for syncope.  All other systems reviewed and are  negative.    Objective     Vital Signs: BP 122/72   Pulse (!) 103   Temp 98.9 F (37.2 C)   Resp 18   Ht 5' 9 (1.753 m)   Wt 125 lb (56.7 kg)   SpO2 95%   BMI 18.46 kg/m    Physical Exam Constitutional:       Appearance: Normal appearance. She is normal weight.  HENT:     Head: Normocephalic and atraumatic.     Right Ear: Hearing, ear canal and external ear normal. Tympanic membrane is bulging.     Left Ear: Hearing, tympanic membrane, ear canal and external ear normal.     Nose: Congestion and rhinorrhea present. Rhinorrhea is clear.     Mouth/Throat:     Lips: Pink.     Mouth: Mucous membranes are moist.     Pharynx: Posterior oropharyngeal erythema and postnasal drip present.  Cardiovascular:     Rate and Rhythm: Normal rate and regular rhythm.     Heart sounds: Normal heart sounds, S1 normal and S2 normal.  Pulmonary:     Effort: Pulmonary effort is normal.     Breath sounds: Normal breath sounds.  Musculoskeletal:        General: Normal range of motion.     Cervical back: Normal range of motion.  Lymphadenopathy:     Cervical: No cervical adenopathy.  Skin:    General: Skin is warm and dry.  Neurological:     General: No focal deficit present.     Mental Status: She is alert and oriented to person, place, and time. Mental status is at baseline.  Psychiatric:        Mood and Affect: Mood normal.        Behavior: Behavior normal.        Thought Content: Thought content normal.        Judgment: Judgment normal.

## 2023-05-28 ENCOUNTER — Other Ambulatory Visit: Payer: Self-pay | Admitting: Family Medicine

## 2023-05-28 DIAGNOSIS — Z1231 Encounter for screening mammogram for malignant neoplasm of breast: Secondary | ICD-10-CM

## 2023-06-10 ENCOUNTER — Ambulatory Visit
Admission: RE | Admit: 2023-06-10 | Discharge: 2023-06-10 | Disposition: A | Discharge status: Home / Self Care | Source: Ambulatory Visit | Attending: Family Medicine | Admitting: Family Medicine

## 2023-06-10 DIAGNOSIS — Z1231 Encounter for screening mammogram for malignant neoplasm of breast: Secondary | ICD-10-CM

## 2023-11-04 ENCOUNTER — Other Ambulatory Visit: Payer: Self-pay | Admitting: Medical Genetics

## 2023-12-07 ENCOUNTER — Inpatient Hospital Stay: Attending: Hematology and Oncology | Admitting: Hematology and Oncology

## 2023-12-07 ENCOUNTER — Inpatient Hospital Stay

## 2023-12-07 VITALS — BP 110/82 | HR 60 | Temp 97.9°F | Resp 13 | Wt 136.7 lb

## 2023-12-07 DIAGNOSIS — F17210 Nicotine dependence, cigarettes, uncomplicated: Secondary | ICD-10-CM | POA: Insufficient documentation

## 2023-12-07 DIAGNOSIS — Z823 Family history of stroke: Secondary | ICD-10-CM | POA: Diagnosis not present

## 2023-12-07 DIAGNOSIS — Z8639 Personal history of other endocrine, nutritional and metabolic disease: Secondary | ICD-10-CM | POA: Insufficient documentation

## 2023-12-07 DIAGNOSIS — Z8249 Family history of ischemic heart disease and other diseases of the circulatory system: Secondary | ICD-10-CM | POA: Diagnosis not present

## 2023-12-07 DIAGNOSIS — Z832 Family history of diseases of the blood and blood-forming organs and certain disorders involving the immune mechanism: Secondary | ICD-10-CM | POA: Diagnosis present

## 2023-12-07 LAB — CBC WITH DIFFERENTIAL (CANCER CENTER ONLY)
Abs Immature Granulocytes: 0.02 K/uL (ref 0.00–0.07)
Basophils Absolute: 0.1 K/uL (ref 0.0–0.1)
Basophils Relative: 1 %
Eosinophils Absolute: 0.3 K/uL (ref 0.0–0.5)
Eosinophils Relative: 4 %
HCT: 44.7 % (ref 36.0–46.0)
Hemoglobin: 15.4 g/dL — ABNORMAL HIGH (ref 12.0–15.0)
Immature Granulocytes: 0 %
Lymphocytes Relative: 38 %
Lymphs Abs: 2.4 K/uL (ref 0.7–4.0)
MCH: 30.6 pg (ref 26.0–34.0)
MCHC: 34.5 g/dL (ref 30.0–36.0)
MCV: 88.7 fL (ref 80.0–100.0)
Monocytes Absolute: 0.4 K/uL (ref 0.1–1.0)
Monocytes Relative: 6 %
Neutro Abs: 3.2 K/uL (ref 1.7–7.7)
Neutrophils Relative %: 51 %
Platelet Count: 257 K/uL (ref 150–400)
RBC: 5.04 MIL/uL (ref 3.87–5.11)
RDW: 12.3 % (ref 11.5–15.5)
WBC Count: 6.3 K/uL (ref 4.0–10.5)
nRBC: 0 % (ref 0.0–0.2)

## 2023-12-07 LAB — CMP (CANCER CENTER ONLY)
ALT: 21 U/L (ref 0–44)
AST: 20 U/L (ref 15–41)
Albumin: 5 g/dL (ref 3.5–5.0)
Alkaline Phosphatase: 80 U/L (ref 38–126)
Anion gap: 8 (ref 5–15)
BUN: 15 mg/dL (ref 6–20)
CO2: 29 mmol/L (ref 22–32)
Calcium: 9.9 mg/dL (ref 8.9–10.3)
Chloride: 103 mmol/L (ref 98–111)
Creatinine: 0.86 mg/dL (ref 0.44–1.00)
GFR, Estimated: >60 mL/min (ref >60)
Glucose, Bld: 81 mg/dL (ref 70–99)
Potassium: 3.8 mmol/L (ref 3.5–5.1)
Sodium: 140 mmol/L (ref 135–145)
Total Bilirubin: 0.6 mg/dL (ref 0.0–1.2)
Total Protein: 8.4 g/dL — ABNORMAL HIGH (ref 6.5–8.1)

## 2023-12-07 LAB — ANTITHROMBIN III: AntiThromb III Func: 125 % — ABNORMAL HIGH (ref 75–120)

## 2023-12-08 LAB — PROTEIN S ACTIVITY: Protein S Activity: 73 % (ref 63–140)

## 2023-12-08 LAB — LUPUS ANTICOAGULANT PANEL
DRVVT: 33.4 s (ref 0.0–47.0)
PTT Lupus Anticoagulant: 34.5 s (ref 0.0–43.5)

## 2023-12-08 LAB — PROTEIN S, TOTAL: Protein S Ag, Total: 102 % (ref 60–150)

## 2023-12-08 LAB — PROTEIN C ACTIVITY: Protein C Activity: 110 % (ref 73–180)

## 2023-12-09 LAB — HOMOCYSTEINE: Homocysteine: 13.3 umol/L (ref 0.0–14.5)

## 2023-12-09 LAB — BETA-2-GLYCOPROTEIN I ABS, IGG/M/A
Beta-2 Glyco I IgG: <9 GPI IgG units (ref 0–20)
Beta-2-Glycoprotein I IgA: <9 GPI IgA units (ref 0–25)
Beta-2-Glycoprotein I IgM: <9 GPI IgM units (ref 0–32)

## 2023-12-10 LAB — CARDIOLIPIN ANTIBODIES, IGG, IGM, IGA
Anticardiolipin IgA: <9 U/mL (ref 0–11)
Anticardiolipin IgG: <9 GPL U/mL (ref 0–14)
Anticardiolipin IgM: <9 [MPL'U]/mL (ref 0–12)

## 2023-12-10 LAB — FACTOR 5 LEIDEN

## 2023-12-11 LAB — PROTEIN C, TOTAL: Protein C, Total: 84 % (ref 60–150)

## 2023-12-11 LAB — PROTHROMBIN GENE MUTATION

## 2023-12-12 NOTE — Progress Notes (Signed)
 [facility] Telephone:([phone]   [phone]  INITIAL CONSULT NOTE  Patient Care Team: Douglass Ivanoff, MD as PCP - General (Family Medicine)  Hematological/Oncological History # Family History Of Coagulation Disorder  CHIEF COMPLAINTS/PURPOSE OF CONSULTATION:  Family History of Coagulation Disorder   HISTORY OF PRESENTING ILLNESS:  Vanessa Mckenzie 53 y.o. female with medical history significant for depression who presents for evaluation for possible family history of coagulation disorder.  On review of the previous records Vanessa Mckenzie was present previously when her daughter visited our clinic due to concern for the sudden death of her sister.  Due to concern that she may be carrying a coagulation disorder the patient presents today for blood work and evaluation.  On exam today Vanessa Mckenzie reports that she is still grieving and having a difficult time with her daughter's death.  She reports that she has no prior history of problems with the blood.  She has never had a superficial thrombophlebitis, blood clot, or anything concerning for a DVT/PE.  She reports that she has anemia and iron issues before in the past, but it has been many years since she has had any problems.  On further discussion she reports that her father had heart disease and passed away 3 years ago due to congestive heart failure.  Her maternal grandmother had stroke and her mother also had heart disease.  She reports no family history of DVTs and PEs other than the sudden death of her daughter.  Her daughter was 65 years old and died from numerous cerebral blood clots.  She was on NuvaRing.  Her other daughter Edsel presented for evaluation our clinic and no hypercoagulable disorder was found.  The patient reports that she is a smoker smoking half a pack per day.  She does not drink any alcohol.  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  She reports she has had no other  major changes in her health in the interim since our last visit.  She reports that she does have hot flashes but is not currently considering HRT therapy.  A full 10 point ROS is otherwise negative.  MEDICAL HISTORY:  Past Medical History:  Diagnosis Date   Depression     SURGICAL HISTORY: Past Surgical History:  Procedure Laterality Date   ABDOMINAL HYSTERECTOMY     BREAST BIOPSY      SOCIAL HISTORY: Social History   Socioeconomic History   Marital status: Single    Spouse name: Not on file   Number of children: Not on file   Years of education: Not on file   Highest education level: Not on file  Occupational History   Not on file  Tobacco Use   Smoking status: Some Days    Types: E-cigarettes   Smokeless tobacco: Never  Substance and Sexual Activity   Alcohol use: Yes    Comment: occasional   Drug use: No   Sexual activity: Not on file  Other Topics Concern   Not on file  Social History Narrative   Not on file   Social Drivers of Health   Financial Resource Strain: Not on file  Food Insecurity: No Food Insecurity (12/07/2023)   Hunger Vital Sign    Worried About Running Out of Food in the Last Year: Never true    Ran Out of Food in the Last Year: Never true  Transportation Needs: No Transportation Needs (12/07/2023)   PRAPARE - Transportation    Lack of  Transportation (Medical): No    Lack of Transportation (Non-Medical): No  Physical Activity: Not on file  Stress: Not on file  Social Connections: Not on file  Intimate Partner Violence: Not At Risk (12/07/2023)   Humiliation, Afraid, Rape, and Kick questionnaire    Fear of Current or Ex-Partner: No    Emotionally Abused: No    Physically Abused: No    Sexually Abused: No    FAMILY HISTORY: No family history on file.  ALLERGIES:  is allergic to codeine.  MEDICATIONS:  Current Outpatient Medications  Medication Sig Dispense Refill   rizatriptan (MAXALT) 10 MG tablet Take 1 tablet by mouth daily as  needed for migraine.     No current facility-administered medications for this visit.    REVIEW OF SYSTEMS:   Constitutional: ( - ) fevers, ( - )  chills , ( - ) night sweats Eyes: ( - ) blurriness of vision, ( - ) double vision, ( - ) watery eyes Ears, nose, mouth, throat, and face: ( - ) mucositis, ( - ) sore throat Respiratory: ( - ) cough, ( - ) dyspnea, ( - ) wheezes Cardiovascular: ( - ) palpitation, ( - ) chest discomfort, ( - ) lower extremity swelling Gastrointestinal:  ( - ) nausea, ( - ) heartburn, ( - ) change in bowel habits Skin: ( - ) abnormal skin rashes Lymphatics: ( - ) new lymphadenopathy, ( - ) easy bruising Neurological: ( - ) numbness, ( - ) tingling, ( - ) new weaknesses Behavioral/Psych: ( - ) mood change, ( - ) new changes  All other systems were reviewed with the patient and are negative.  PHYSICAL EXAMINATION:  Vitals:   12/07/23 1308  BP: 110/82  Pulse: 60  Resp: 13  Temp: 97.9 F (36.6 C)  SpO2: 100%   Filed Weights   12/07/23 1308  Weight: 136 lb 11.2 oz (62 kg)    GENERAL: well appearing middle-age Caucasian female in NAD  SKIN: skin color, texture, turgor are normal, no rashes or significant lesions EYES: conjunctiva are pink and non-injected, sclera clear LUNGS: clear to auscultation and percussion with normal breathing effort HEART: regular rate & rhythm and no murmurs and no lower extremity edema Musculoskeletal: no cyanosis of digits and no clubbing  PSYCH: alert & oriented x 3, fluent speech NEURO: no focal motor/sensory deficits  LABORATORY DATA:  I have reviewed the data as listed    Latest Ref Rng & Units 12/07/2023    2:27 PM 06/21/2016   11:27 PM 05/03/2007   10:40 AM  CBC  WBC 4.0 - 10.5 K/uL 6.3  8.3  6.1   Hemoglobin 12.0 - 15.0 g/dL 84.5  86.5  86.1   Hematocrit 36.0 - 46.0 % 44.7  40.2  40.4   Platelets 150 - 400 K/uL 257  221  190        Latest Ref Rng & Units 12/07/2023    2:27 PM 06/21/2016   11:27 PM  CMP   Glucose 70 - 99 mg/dL 81  889   BUN 6 - 20 mg/dL 15  16   Creatinine 9.55 - 1.00 mg/dL 9.13  9.00   Sodium 864 - 145 mmol/L 140  139   Potassium 3.5 - 5.1 mmol/L 3.8  3.8   Chloride 98 - 111 mmol/L 103  105   CO2 22 - 32 mmol/L 29  27   Calcium 8.9 - 10.3 mg/dL 9.9  9.3   Total Protein 6.5 -  8.1 g/dL 8.4  7.7   Total Bilirubin 0.0 - 1.2 mg/dL 0.6  0.4   Alkaline Phos 38 - 126 U/L 80  67   AST 15 - 41 U/L 20  19   ALT 0 - 44 U/L 21  20      ASSESSMENT & PLAN Vanessa Mckenzie 53 y.o. female with medical history significant for depression who presents for evaluation for possible family history of coagulation disorder.  After review of the labs, review of the records, and discussion with the patient the patients findings are most consistent with a strong family history for severe coagulation disorder.  The patient's daughter was never officially diagnosed with a coagulation disorder but died at the age of 86 from severe cerebral blood clots.  She was on the NuvaRing but no other clear inciting factor was found.  The patient's other daughter was evaluated in our clinic as well, no coagulation disorder found.  At this time I would recommend a full hypercoagulation workup to rule out a possible hereditary disorder.  Additionally I would recommend the patient stop smoking.  She can also consider starting aspirin 81 mg p.o. daily for thromboprophylaxis.  Additionally I would recommend avoiding HRT as she is going through menopause.  The patient voiced understanding of our findings and recommendation.  The patient is also having a hard time with the tragic passing of her daughter.  It is been approximately 3 months, however I do believe she would benefit from grief counseling.  Referral placed to our social worker.  Orders Placed This Encounter  Procedures   CBC with Differential (Cancer Center Only)    Standing Status:   Future    Number of Occurrences:   1    Expiration Date:   12/06/2024    CMP (Cancer Center only)    Standing Status:   Future    Number of Occurrences:   1    Expiration Date:   12/06/2024   Antithrombin III    Protein C activity   Protein C, total   Protein S activity   Protein S, total   Lupus anticoagulant panel   Beta-2 -glycoprotein i abs, IgG/M/A   Homocysteine, serum   Factor 5 leiden   Prothrombin gene mutation   Cardiolipin antibodies, IgG, IgM, IgA    All questions were answered. The patient knows to call the clinic with any problems, questions or concerns.  A total of more than 60 minutes were spent on this encounter with face-to-face time and non-face-to-face time, including preparing to see the patient, ordering tests and/or medications, counseling the patient and coordination of care as outlined above.   Vanessa Mckenzie Kidney, MD Department of Hematology/Oncology [facility] Phone: [phone] Pager: [phone] Email: [email or web address]  12/12/2023 12:31 PM

## 2023-12-14 ENCOUNTER — Ambulatory Visit: Payer: Self-pay | Admitting: *Deleted

## 2023-12-14 NOTE — Telephone Encounter (Signed)
 TCT patient regarding recent lab results. No answer but was able to leave vm message for her to return this call to [phone] regarding the message from Dr. Federico:  To advise that her blood work showed no evidence of a clotting disorder.  There is no need for other members of the family to be tested for hypercoagulable disorders.  I do still  think it would be wise for her to take baby aspirin 81 mg p.o. daily and avoid estrogen based therapies in the future.  Additionally please assure that she has made contact with our social worker for  grief counseling or with [facility]. Pt was given the # for this. There is no need for routine follow-up in our clinic.

## 2023-12-14 NOTE — Telephone Encounter (Signed)
-----   Message from Norleen ONEIDA Kidney IV sent at 12/12/2023 12:31 PM EDT ----- Please let Ms. Holtman know that her blood work showed no evidence of a clotting disorder.  There is no need for other members of the family to be tested for hypercoagulable disorders.  I do still  think it would be wise for her to take baby aspirin 81 mg p.o. daily and avoid estrogen based therapies in the future.  Additionally please assure that she has made contact with our social worker for  grief counseling.  There is no need for routine follow-up in our clinic. ----- Message ----- From: Rebecka, [facility] [place] Sent: 12/07/2023   7:43 PM EDT To: Norleen ONEIDA Kidney MADISON, MD

## 2023-12-16 NOTE — Telephone Encounter (Signed)
-----   Message from Norleen ONEIDA Kidney IV sent at 12/12/2023 12:31 PM EDT ----- Please let Ms. Vanessa Mckenzie know that her blood work showed no evidence of a clotting disorder.  There is no need for other members of the family to be tested for hypercoagulable disorders.  I do still  think it would be wise for her to take baby aspirin 81 mg p.o. daily and avoid estrogen based therapies in the future.  Additionally please assure that she has made contact with our social worker for  grief counseling.  There is no need for routine follow-up in our clinic. ----- Message ----- From: Rebecka, [facility] [place] Sent: 12/07/2023   7:43 PM EDT To: Norleen ONEIDA Kidney MADISON, MD

## 2023-12-16 NOTE — Telephone Encounter (Signed)
 TCT patient regarding recent lab results. Spoke with her. Advised  that her blood work showed no evidence of a clotting disorder.  There is no need for other members of the family to be tested for hypercoagulable disorders.  Dr. Federico does think it would be wise for her to take baby aspirin 81 mg p.o. daily and avoid estrogen based therapies in the future. Pt voiced understanding. She has contacted a group called [facility] for her grief counselling needs.

## 2024-01-18 ENCOUNTER — Other Ambulatory Visit: Payer: Self-pay | Admitting: Medical Genetics

## 2024-01-18 DIAGNOSIS — Z006 Encounter for examination for normal comparison and control in clinical research program: Secondary | ICD-10-CM
